# Patient Record
Sex: Male | Born: 1990 | Race: Black or African American | Hispanic: No | Marital: Single | Smoking: Current every day smoker
Health system: Southern US, Community
[De-identification: ages and names within clinical notes are randomized; demographics above are authoritative.]

---
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
## 2009-03-04 ENCOUNTER — Emergency Department: Payer: Self-pay | Admitting: Emergency Medicine

## 2012-09-23 ENCOUNTER — Emergency Department: Payer: Self-pay | Admitting: Emergency Medicine

## 2013-06-16 ENCOUNTER — Emergency Department: Payer: Self-pay | Admitting: Emergency Medicine

## 2014-11-12 ENCOUNTER — Emergency Department
Admission: EM | Admit: 2014-11-12 | Discharge: 2014-11-12 | Disposition: A | Discharge status: Home / Self Care | Payer: No Typology Code available for payment source | Attending: Emergency Medicine | Admitting: Emergency Medicine

## 2014-11-12 ENCOUNTER — Encounter: Payer: Self-pay | Admitting: Emergency Medicine

## 2014-11-12 ENCOUNTER — Emergency Department: Payer: No Typology Code available for payment source

## 2014-11-12 DIAGNOSIS — Y92410 Unspecified street and highway as the place of occurrence of the external cause: Secondary | ICD-10-CM | POA: Diagnosis not present

## 2014-11-12 DIAGNOSIS — S0083XA Contusion of other part of head, initial encounter: Secondary | ICD-10-CM | POA: Diagnosis not present

## 2014-11-12 DIAGNOSIS — Z720 Tobacco use: Secondary | ICD-10-CM | POA: Diagnosis not present

## 2014-11-12 DIAGNOSIS — S0990XA Unspecified injury of head, initial encounter: Secondary | ICD-10-CM | POA: Diagnosis present

## 2014-11-12 DIAGNOSIS — Y9389 Activity, other specified: Secondary | ICD-10-CM | POA: Insufficient documentation

## 2014-11-12 DIAGNOSIS — S161XXA Strain of muscle, fascia and tendon at neck level, initial encounter: Secondary | ICD-10-CM | POA: Diagnosis not present

## 2014-11-12 DIAGNOSIS — Y998 Other external cause status: Secondary | ICD-10-CM | POA: Diagnosis not present

## 2014-11-12 DIAGNOSIS — S0093XA Contusion of unspecified part of head, initial encounter: Secondary | ICD-10-CM

## 2014-11-12 MED ORDER — CYCLOBENZAPRINE HCL 10 MG PO TABS: 10.0000 mg | ORAL_TABLET | Freq: Three times a day (TID) | ORAL | Status: AC | PRN

## 2014-11-12 MED ORDER — IBUPROFEN 800 MG PO TABS: 800.0000 mg | ORAL_TABLET | Freq: Three times a day (TID) | ORAL | Status: AC | PRN

## 2014-11-12 MED ORDER — HYDROCODONE-ACETAMINOPHEN 5-325 MG PO TABS
2.0000 | ORAL_TABLET | Freq: Once | ORAL | Status: AC
  Administered 2014-11-12: 2 via ORAL
  Filled 2014-11-12: qty 2

## 2014-11-12 MED ORDER — DIAZEPAM 5 MG/ML IJ SOLN
5.0000 mg | Freq: Once | INTRAMUSCULAR | Status: AC
  Administered 2014-11-12: 5 mg via INTRAVENOUS
  Filled 2014-11-12: qty 2

## 2014-11-12 NOTE — Discharge Instructions (Signed)
Cervical Sprain °A cervical sprain is an injury in the neck in which the strong, fibrous tissues (ligaments) that connect your neck bones stretch or tear. Cervical sprains can range from mild to severe. Severe cervical sprains can cause the neck vertebrae to be unstable. This can lead to damage of the spinal cord and can result in serious nervous system problems. The amount of time it takes for a cervical sprain to get better depends on the cause and extent of the injury. Most cervical sprains heal in 1 to 3 weeks. °CAUSES  °Severe cervical sprains may be caused by:  °· Contact sport injuries (such as from football, rugby, wrestling, hockey, auto racing, gymnastics, diving, martial arts, or boxing).   °· Motor vehicle collisions.   °· Whiplash injuries. This is an injury from a sudden forward and backward whipping movement of the head and neck.  °· Falls.   °Mild cervical sprains may be caused by:  °· Being in an awkward position, such as while cradling a telephone between your ear and shoulder.   °· Sitting in a chair that does not offer proper support.   °· Working at a poorly designed computer station.   °· Looking up or down for long periods of time.   °SYMPTOMS  °· Pain, soreness, stiffness, or a burning sensation in the front, back, or sides of the neck. This discomfort may develop immediately after the injury or slowly, 24 hours or more after the injury.   °· Pain or tenderness directly in the middle of the back of the neck.   °· Shoulder or upper back pain.   °· Limited ability to move the neck.   °· Headache.   °· Dizziness.   °· Weakness, numbness, or tingling in the hands or arms.   °· Muscle spasms.   °· Difficulty swallowing or chewing.   °· Tenderness and swelling of the neck.   °DIAGNOSIS  °Most of the time your health care provider can diagnose a cervical sprain by taking your history and doing a physical exam. Your health care provider will ask about previous neck injuries and any known neck  problems, such as arthritis in the neck. X-rays may be taken to find out if there are any other problems, such as with the bones of the neck. Other tests, such as a CT scan or MRI, may also be needed.  °TREATMENT  °Treatment depends on the severity of the cervical sprain. Mild sprains can be treated with rest, keeping the neck in place (immobilization), and pain medicines. Severe cervical sprains are immediately immobilized. Further treatment is done to help with pain, muscle spasms, and other symptoms and may include: °· Medicines, such as pain relievers, numbing medicines, or muscle relaxants.   °· Physical therapy. This may involve stretching exercises, strengthening exercises, and posture training. Exercises and improved posture can help stabilize the neck, strengthen muscles, and help stop symptoms from returning.   °HOME CARE INSTRUCTIONS  °· Put ice on the injured area.   °¨ Put ice in a plastic bag.   °¨ Place a towel between your skin and the bag.   °¨ Leave the ice on for 15-20 minutes, 3-4 times a day.   °· If your injury was severe, you may have been given a cervical collar to wear. A cervical collar is a two-piece collar designed to keep your neck from moving while it heals. °¨ Do not remove the collar unless instructed by your health care provider. °¨ If you have long hair, keep it outside of the collar. °¨ Ask your health care provider before making any adjustments to your collar. Minor   adjustments may be required over time to improve comfort and reduce pressure on your chin or on the back of your head. °¨ If you are allowed to remove the collar for cleaning or bathing, follow your health care provider's instructions on how to do so safely. °¨ Keep your collar clean by wiping it with mild soap and water and drying it completely. If the collar you have been given includes removable pads, remove them every 1-2 days and hand wash them with soap and water. Allow them to air dry. They should be completely  dry before you wear them in the collar. °¨ If you are allowed to remove the collar for cleaning and bathing, wash and dry the skin of your neck. Check your skin for irritation or sores. If you see any, tell your health care provider. °¨ Do not drive while wearing the collar.   °· Only take over-the-counter or prescription medicines for pain, discomfort, or fever as directed by your health care provider.   °· Keep all follow-up appointments as directed by your health care provider.   °· Keep all physical therapy appointments as directed by your health care provider.   °· Make any needed adjustments to your workstation to promote good posture.   °· Avoid positions and activities that make your symptoms worse.   °· Warm up and stretch before being active to help prevent problems.   °SEEK MEDICAL CARE IF:  °· Your pain is not controlled with medicine.   °· You are unable to decrease your pain medicine over time as planned.   °· Your activity level is not improving as expected.   °SEEK IMMEDIATE MEDICAL CARE IF:  °· You develop any bleeding. °· You develop stomach upset. °· You have signs of an allergic reaction to your medicine.   °· Your symptoms get worse.   °· You develop new, unexplained symptoms.   °· You have numbness, tingling, weakness, or paralysis in any part of your body.   °MAKE SURE YOU:  °· Understand these instructions. °· Will watch your condition. °· Will get help right away if you are not doing well or get worse. °Document Released: 12/28/2006 Document Revised: 03/07/2013 Document Reviewed: 09/07/2012 °[employer]® Patient Information ©2015 [employer]. This information is not intended to replace advice given to you by your health care provider. Make sure you discuss any questions you have with your health care provider. ° °Head Injury °You have received a head injury. It does not appear serious at this time. Headaches and vomiting are common following head injury. It should be easy to awaken from  sleeping. Sometimes it is necessary for you to stay in the emergency department for a while for observation. Sometimes admission to the hospital may be needed. After injuries such as yours, most problems occur within the first 24 hours, but side effects may occur up to 7-10 days after the injury. It is important for you to carefully monitor your condition and contact your health care provider or seek immediate medical care if there is a change in your condition. °WHAT ARE THE TYPES OF HEAD INJURIES? °Head injuries can be as minor as a bump. Some head injuries can be more severe. More severe head injuries include: °· A jarring injury to the brain (concussion). °· A bruise of the brain (contusion). This mean there is bleeding in the brain that can cause swelling. °· A cracked skull (skull fracture). °· Bleeding in the brain that collects, clots, and forms a bump (hematoma). °WHAT CAUSES A HEAD INJURY? °A serious head injury is most likely to   can be more severe. More severe head injuries include:   A jarring injury to the brain (concussion).   A bruise of the brain (contusion). This mean there is bleeding in the brain that can cause swelling.   A cracked skull (skull fracture).   Bleeding in the brain that collects, clots, and forms a bump (hematoma).  WHAT CAUSES A HEAD INJURY?  A serious head injury is most likely to happen to someone who is in a car wreck and is not wearing a seat belt. Other causes of major head injuries include bicycle or motorcycle accidents, sports injuries, and falls.  HOW ARE HEAD INJURIES DIAGNOSED?  A complete history of the event leading to the injury and your current symptoms will be helpful in diagnosing head injuries. Many times, pictures of the brain, such as CT or MRI are needed to see the extent of the injury. Often, an overnight hospital stay is necessary for observation.   WHEN SHOULD I SEEK IMMEDIATE MEDICAL CARE?   You should get help right away if:   You have confusion or drowsiness.   You feel sick to your stomach (nauseous) or have continued, forceful vomiting.   You have dizziness or unsteadiness that is getting worse.   You have severe, continued headaches not relieved by medicine. Only take over-the-counter or prescription medicines for pain, fever, or discomfort as directed by your health care provider.   You do not have normal function of the  arms or legs or are unable to walk.   You notice changes in the black spots in the center of the colored part of your eye (pupil).   You have a clear or bloody fluid coming from your nose or ears.   You have a loss of vision.  During the next 24 hours after the injury, you must stay with someone who can watch you for the warning signs. This person should contact local emergency services (911 in the [place] if you have seizures, you become unconscious, or you are unable to wake up.  HOW CAN I PREVENT A HEAD INJURY IN THE FUTURE?  The most important factor for preventing major head injuries is avoiding motor vehicle accidents. To minimize the potential for damage to your head, it is crucial to wear seat belts while riding in motor vehicles. Wearing helmets while bike riding and playing collision sports (like football) is also helpful. Also, avoiding dangerous activities around the house will further help reduce your risk of head injury.   WHEN CAN I RETURN TO NORMAL ACTIVITIES AND ATHLETICS?  You should be reevaluated by your health care provider before returning to these activities. If you have any of the following symptoms, you should not return to activities or contact sports until 1 week after the symptoms have stopped:   Persistent headache.   Dizziness or vertigo.   Poor attention and concentration.   Confusion.   Memory problems.   Nausea or vomiting.   Fatigue or tire easily.   Irritability.   Intolerant of bright lights or loud noises.   Anxiety or depression.   Disturbed sleep.  MAKE SURE YOU:    Understand these instructions.   Will watch your condition.   Will get help right away if you are not doing well or get worse.  Document Released: 03/02/2005 Document Revised: 03/07/2013 Document Reviewed: 11/07/2012  [employer] Patient Information 2015 [employer]. This information is not intended to replace advice given to you by your health care provider. Make sure you   discuss any questions you  have with your health care provider.  Motor Vehicle Collision  It is common to have multiple bruises and sore muscles after a motor vehicle collision (MVC). These tend to feel worse for the first 24 hours. You may have the most stiffness and soreness over the first several hours. You may also feel worse when you wake up the first morning after your collision. After this point, you will usually begin to improve with each day. The speed of improvement often depends on the severity of the collision, the number of injuries, and the location and nature of these injuries.  HOME CARE INSTRUCTIONS   Put ice on the injured area.   Put ice in a plastic bag.   Place a towel between your skin and the bag.   Leave the ice on for 15-20 minutes, 3-4 times a day, or as directed by your health care provider.   Drink enough fluids to keep your urine clear or pale yellow. Do not drink alcohol.   Take a warm shower or bath once or twice a day. This will increase blood flow to sore muscles.   You may return to activities as directed by your caregiver. Be careful when lifting, as this may aggravate neck or back pain.   Only take over-the-counter or prescription medicines for pain, discomfort, or fever as directed by your caregiver. Do not use aspirin. This may increase bruising and bleeding.  SEEK IMMEDIATE MEDICAL CARE IF:   You have numbness, tingling, or weakness in the arms or legs.   You develop severe headaches not relieved with medicine.   You have severe neck pain, especially tenderness in the middle of the back of your neck.   You have changes in bowel or bladder control.   There is increasing pain in any area of the body.   You have shortness of breath, light-headedness, dizziness, or fainting.   You have chest pain.   You feel sick to your stomach (nauseous), throw up (vomit), or sweat.   You have increasing abdominal discomfort.   There is blood in your urine, stool, or vomit.   You have pain in your  shoulder (shoulder strap areas).   You feel your symptoms are getting worse.  MAKE SURE YOU:   Understand these instructions.   Will watch your condition.   Will get help right away if you are not doing well or get worse.  Document Released: 03/02/2005 Document Revised: 07/17/2013 Document Reviewed: 07/30/2010  [employer] Patient Information 2015 [employer]. This information is not intended to replace advice given to you by your health care provider. Make sure you discuss any questions you have with your health care provider.
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
## 2014-11-12 NOTE — ED Notes (Signed)
Pt taken of spinal board and c-collar per Dr. Haig Prophet.
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
## 2014-11-14 ENCOUNTER — Encounter: Payer: Self-pay | Admitting: *Deleted

## 2014-11-14 ENCOUNTER — Emergency Department
Admission: EM | Admit: 2014-11-14 | Discharge: 2014-11-14 | Disposition: A | Discharge status: Home / Self Care | Payer: No Typology Code available for payment source | Attending: Emergency Medicine | Admitting: Emergency Medicine

## 2014-11-14 ENCOUNTER — Emergency Department: Payer: No Typology Code available for payment source

## 2014-11-14 DIAGNOSIS — Z79899 Other long term (current) drug therapy: Secondary | ICD-10-CM | POA: Insufficient documentation

## 2014-11-14 DIAGNOSIS — S0990XD Unspecified injury of head, subsequent encounter: Secondary | ICD-10-CM | POA: Insufficient documentation

## 2014-11-14 DIAGNOSIS — M542 Cervicalgia: Secondary | ICD-10-CM | POA: Insufficient documentation

## 2014-11-14 DIAGNOSIS — Z720 Tobacco use: Secondary | ICD-10-CM | POA: Diagnosis not present

## 2014-11-14 DIAGNOSIS — S199XXD Unspecified injury of neck, subsequent encounter: Secondary | ICD-10-CM | POA: Diagnosis present

## 2014-11-14 MED ORDER — DIAZEPAM 5 MG/ML IJ SOLN
5.0000 mg | Freq: Once | INTRAMUSCULAR | Status: AC
  Administered 2014-11-14: 5 mg via INTRAVENOUS
  Filled 2014-11-14: qty 2

## 2014-11-14 MED ORDER — DIAZEPAM 5 MG PO TABS: 5.0000 mg | ORAL_TABLET | Freq: Three times a day (TID) | ORAL | Status: AC | PRN

## 2014-11-14 MED ORDER — ETODOLAC 500 MG PO TABS: 500.0000 mg | ORAL_TABLET | Freq: Two times a day (BID) | ORAL | Status: AC

## 2014-11-14 MED ORDER — HYDROMORPHONE HCL 1 MG/ML IJ SOLN
1.0000 mg | Freq: Once | INTRAMUSCULAR | Status: AC
  Administered 2014-11-14: 1 mg via INTRAVENOUS
  Filled 2014-11-14: qty 1
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
